# Patient Record
Sex: Female | Born: 1960 | Race: Black or African American | Hispanic: No | Marital: Single | Smoking: Former smoker
Health system: Southern US, Community
[De-identification: ages and names within clinical notes are randomized; demographics above are authoritative.]

## PROBLEM LIST (undated history)

## (undated) DIAGNOSIS — I10 Essential (primary) hypertension: Secondary | ICD-10-CM

---

## 2015-10-06 ENCOUNTER — Emergency Department (HOSPITAL_BASED_OUTPATIENT_CLINIC_OR_DEPARTMENT_OTHER)
Admission: EM | Admit: 2015-10-06 | Discharge: 2015-10-06 | Disposition: A | Discharge status: Other Acute Inpatient Hospital | Payer: BLUE CROSS/BLUE SHIELD | Attending: Emergency Medicine | Admitting: Emergency Medicine

## 2015-10-06 ENCOUNTER — Emergency Department (HOSPITAL_BASED_OUTPATIENT_CLINIC_OR_DEPARTMENT_OTHER): Payer: BLUE CROSS/BLUE SHIELD

## 2015-10-06 ENCOUNTER — Encounter (HOSPITAL_BASED_OUTPATIENT_CLINIC_OR_DEPARTMENT_OTHER): Payer: Self-pay | Admitting: *Deleted

## 2015-10-06 ENCOUNTER — Emergency Department (HOSPITAL_COMMUNITY): Payer: BLUE CROSS/BLUE SHIELD

## 2015-10-06 DIAGNOSIS — Z87891 Personal history of nicotine dependence: Secondary | ICD-10-CM | POA: Insufficient documentation

## 2015-10-06 DIAGNOSIS — I10 Essential (primary) hypertension: Secondary | ICD-10-CM | POA: Diagnosis not present

## 2015-10-06 DIAGNOSIS — R200 Anesthesia of skin: Secondary | ICD-10-CM | POA: Diagnosis not present

## 2015-10-06 HISTORY — DX: Essential (primary) hypertension: I10

## 2015-10-06 LAB — DIFFERENTIAL
BASOS ABS: 0 10*3/uL (ref 0.0–0.1)
Basophils Relative: 0 %
EOS PCT: 2 %
Eosinophils Absolute: 0.1 10*3/uL (ref 0.0–0.7)
LYMPHS ABS: 1.6 10*3/uL (ref 0.7–4.0)
LYMPHS PCT: 29 %
Monocytes Absolute: 0.4 10*3/uL (ref 0.1–1.0)
Monocytes Relative: 8 %
NEUTROS ABS: 3.5 10*3/uL (ref 1.7–7.7)
NEUTROS PCT: 61 %

## 2015-10-06 LAB — CBC
HCT: 37 % (ref 36.0–46.0)
HEMOGLOBIN: 11.8 g/dL — AB (ref 12.0–15.0)
MCH: 27.6 pg (ref 26.0–34.0)
MCHC: 31.9 g/dL (ref 30.0–36.0)
MCV: 86.4 fL (ref 78.0–100.0)
Platelets: 275 10*3/uL (ref 150–400)
RBC: 4.28 MIL/uL (ref 3.87–5.11)
RDW: 15.8 % — ABNORMAL HIGH (ref 11.5–15.5)
WBC: 5.7 10*3/uL (ref 4.0–10.5)

## 2015-10-06 LAB — URINALYSIS, ROUTINE W REFLEX MICROSCOPIC
Bilirubin Urine: NEGATIVE
Glucose, UA: NEGATIVE mg/dL
Hgb urine dipstick: NEGATIVE
Ketones, ur: NEGATIVE mg/dL
Leukocytes, UA: NEGATIVE
NITRITE: NEGATIVE
PROTEIN: NEGATIVE mg/dL
SPECIFIC GRAVITY, URINE: 1.015 (ref 1.005–1.030)
pH: 6 (ref 5.0–8.0)

## 2015-10-06 LAB — RAPID URINE DRUG SCREEN, HOSP PERFORMED
Amphetamines: NOT DETECTED
BARBITURATES: NOT DETECTED
Benzodiazepines: NOT DETECTED
Cocaine: NOT DETECTED
Opiates: NOT DETECTED
Tetrahydrocannabinol: POSITIVE — AB

## 2015-10-06 LAB — COMPREHENSIVE METABOLIC PANEL
ALBUMIN: 3.8 g/dL (ref 3.5–5.0)
ALK PHOS: 104 U/L (ref 38–126)
ALT: 10 U/L — AB (ref 14–54)
AST: 22 U/L (ref 15–41)
Anion gap: 11 (ref 5–15)
BILIRUBIN TOTAL: 0.4 mg/dL (ref 0.3–1.2)
BUN: 15 mg/dL (ref 6–20)
CALCIUM: 8.8 mg/dL — AB (ref 8.9–10.3)
CO2: 23 mmol/L (ref 22–32)
CREATININE: 0.87 mg/dL (ref 0.44–1.00)
Chloride: 109 mmol/L (ref 101–111)
GFR calc Af Amer: >60 mL/min (ref >60)
GFR calc non Af Amer: >60 mL/min (ref >60)
GLUCOSE: 90 mg/dL (ref 65–99)
Potassium: 3.4 mmol/L — ABNORMAL LOW (ref 3.5–5.1)
Sodium: 143 mmol/L (ref 135–145)
TOTAL PROTEIN: 7 g/dL (ref 6.5–8.1)

## 2015-10-06 LAB — APTT: aPTT: 28 seconds (ref 24–37)

## 2015-10-06 LAB — TROPONIN I

## 2015-10-06 LAB — ETHANOL: Alcohol, Ethyl (B): <5 mg/dL (ref <5)

## 2015-10-06 LAB — PROTIME-INR
INR: 0.93 (ref 0.00–1.49)
Prothrombin Time: 12.7 seconds (ref 11.6–15.2)

## 2015-10-06 MED ORDER — ASPIRIN 81 MG PO CHEW
81.0000 mg | CHEWABLE_TABLET | Freq: Once | ORAL | Status: AC
  Administered 2015-10-06: 81 mg via ORAL
  Filled 2015-10-06: qty 1

## 2015-10-06 NOTE — ED Notes (Signed)
Woke with numbness to the left side of her face and numbness to her finger tips of her left hand.

## 2015-10-06 NOTE — ED Provider Notes (Signed)
  Physical Exam  BP 147/92 mmHg  Pulse 78  Temp(Src) 98.4 F (36.9 C) (Oral)  Resp 14  Ht  (1.651 m)  Wt 201 lb (91.173 kg)  BMI 33.45 kg/m2  SpO2 100%  Physical Exam  ED Course  Procedures  MDM Patient transferred from [facility]. Sent for MRI after numbness of face and upper extremity. MRI reassuring. Reviewed with Dr. Lavon Paganini. Will discharge with outpatient follow-up. Patient states she has been having headaches does have possible, acute migraine.      Benjiman Core, MD 10/06/15 [phone]

## 2015-10-06 NOTE — ED Provider Notes (Signed)
CSN: [id number]     Arrival date & time 10/06/15  1248 History   First MD Initiated Contact with Patient 10/06/15 1328     Chief Complaint  Patient presents with  . Numbness     (Consider location/radiation/quality/duration/timing/severity/associated sxs/prior Treatment) The history is provided by the patient and medical records.    55 year old female with history of hypertension, diabetes, hyperlipidemia, presenting to the ED for numbness. Patient states she was watching television with her daughter last night and felt fine. She states she went to bed at approximately 0230 this morning. She states she woke up around 0900 and felt numbness to the entire left side of her face and fingertips of her left hand. She states she feels it on the left side of her lips as well.  No oral or lip swelling.  States she feels her face feels like she has been numbed up with Novocain at the dentist.  She denies any visual disturbance, slurred speech, facial droop, weakness, dizziness, or difficulty ambulating. Patient denies history of similar symptoms in the past. She denies any current headache or neck pain. No recent fever, chills, or other illness.  Patient states her grandmother did have a massive brainstem stroke, she was in her 80s when this occurred, but she did have many strokes prior to this. Patient is not currently a smoker. Denies any recent alcohol or drug use.  Not currently on anti-coagulation.  VSS.  Past Medical History  Diagnosis Date  . Hypertension    History reviewed. No pertinent past surgical history. No family history on file. Social History  Substance Use Topics  . Smoking status: Former Games developer  . Smokeless tobacco: None  . Alcohol Use: No   OB History    No data available     Review of Systems  Neurological: Positive for numbness.  All other systems reviewed and are negative.     Allergies  Review of patient's allergies indicates no known allergies.  Home Medications    Prior to Admission medications   Not on File   BP 149/95 mmHg  Pulse 74  Temp(Src) 98.4 F (36.9 C) (Oral)  Resp 21  Ht  (1.651 m)  Wt 91.173 kg  BMI 33.45 kg/m2  SpO2 100%   Physical Exam  Constitutional: She is oriented to person, place, and time. She appears well-developed and well-nourished. No distress.  HENT:  Head: Normocephalic and atraumatic.  Mouth/Throat: Oropharynx is clear and moist.  Eyes: Conjunctivae and EOM are normal. Pupils are equal, round, and reactive to light.  Neck: Normal range of motion. Neck supple.  Cardiovascular: Normal rate, regular rhythm and normal heart sounds.   Pulmonary/Chest: Effort normal and breath sounds normal. No respiratory distress. She has no wheezes.  Abdominal: Soft. Bowel sounds are normal. There is no tenderness. There is no guarding.  Musculoskeletal: Normal range of motion.  Neurological: She is alert and oriented to person, place, and time.  AAOx3, answering questions and following commands appropriately; equal strength UE and LE bilaterally; moves all extremities appropriately without ataxia; normal finger to nose bilaterally without dysmetria, no pronator drift; endorses decreased sensation to left forehead, cheek, chin, and lips; symmetric forehead wrinkle; no facial droop noted  Skin: Skin is warm and dry. She is not diaphoretic.  Psychiatric: She has a normal mood and affect.  Nursing note and vitals reviewed.   ED Course  Procedures (including critical care time) Labs Review Labs Reviewed  CBC - Abnormal; Notable for the following:  Hemoglobin 11.8 (*)    RDW 15.8 (*)    All other components within normal limits  COMPREHENSIVE METABOLIC PANEL - Abnormal; Notable for the following:    Potassium 3.4 (*)    Calcium 8.8 (*)    ALT 10 (*)    All other components within normal limits  URINE RAPID DRUG SCREEN, HOSP PERFORMED - Abnormal; Notable for the following:    Tetrahydrocannabinol POSITIVE (*)    All  other components within normal limits  ETHANOL  PROTIME-INR  APTT  DIFFERENTIAL  URINALYSIS, ROUTINE W REFLEX MICROSCOPIC (NOT AT [facility])  TROPONIN I    Imaging Review Ct Head Wo Contrast  10/06/2015  CLINICAL DATA:  Woke up this morning with left facial numbness and tingling in finger tips EXAM: CT HEAD WITHOUT CONTRAST TECHNIQUE: Contiguous axial images were obtained from the base of the skull through the vertex without intravenous contrast. COMPARISON:  None. FINDINGS: Mild age-related cortical atrophy. No abnormal attenuation to suggest vascular territory infarct or mass. No hydrocephalus. No parenchymal hemorrhage or extra-axial fluid. Calvarium intact. IMPRESSION: Head CT is normal for age. Electronically Signed   By: Esperanza Heir M.D.   On: 10/06/2015 14:00   I have personally reviewed and evaluated these images and lab results as part of my medical decision-making.   EKG Interpretation None      MDM   Final diagnoses:  Left facial numbness   55 year old female here for left facial numbness and paresthesias of left fingertips upon waking this morning at 0900.  Last known well was 0230 this morning.  On exam patient is in no acute distress. Status subjective numbness to the left side of her face including the lips. She has no apparent facial droop and is otherwise neurologically intact. Initial labs reassuring, UDS is positive for THC.  CT head negative for acute abnormalities. Patient was given dose of aspirin. Will speak with neurology as patient likely will need transfer for MRI to definitively rule out stroke.  2:35 PM  Case discussed with neurology, Dr. Hosie Poisson-- recommends transfer to [facility] for [facility].  Will do ED-ED transfer.  If negative, may be discharge with outpatient follow-up.  Will need admission for any acute findings.  ER physician at [facility], Dr. Anitra Lauth, notified of transfer.  Patient and family updated with plan of care, vague knowledge understanding and all questions  were answered. Patient remains stable at this time.  4:07 PM Patient still waiting for [facility] transfer at this time.  Remains stable without complaints.  Garlon Hatchet, PA-C 10/06/15 1608  Courteney Randall An, MD 10/10/15 2238

## 2015-10-06 NOTE — ED Notes (Signed)
The admission vs were done at 1250

## 2015-10-06 NOTE — ED Notes (Signed)
Report from [facility]: 153/100, pt coming from [facility] with c/o left arm and jaw numbness. Pt has passed swallow screen, alert and oriented. CT scan negative.

## 2015-10-06 NOTE — Discharge Instructions (Signed)
Paresthesia Paresthesia is an abnormal burning or prickling sensation. This sensation is generally felt in the hands, arms, legs, or feet. However, it may occur in any part of the body. Usually, it is not painful. The feeling may be described as:  Tingling or numbness.  Pins and needles.  Skin crawling.  Buzzing.  Limbs falling asleep.  Itching. Most people experience temporary (transient) paresthesia at some time in their lives. Paresthesia may occur when you breathe too quickly (hyperventilation). It can also occur without any apparent cause. Commonly, paresthesia occurs when pressure is placed on a nerve. The sensation quickly goes away after the pressure is removed. For some people, however, paresthesia is a long-lasting (chronic) condition that is caused by an underlying disorder. If you continue to have paresthesia, you may need further medical evaluation. HOME CARE INSTRUCTIONS Watch your condition for any changes. Taking the following actions may help to lessen any discomfort that you are feeling:  Avoid drinking alcohol.  Try acupuncture or massage to help relieve your symptoms.  Keep all follow-up visits as directed by your health care provider. This is important. SEEK MEDICAL CARE IF:  You continue to have episodes of paresthesia.  Your burning or prickling feeling gets worse when you walk.  You have pain, cramps, or dizziness.  You develop a rash. SEEK IMMEDIATE MEDICAL CARE IF:  You feel weak.  You have trouble walking or moving.  You have problems with speech, understanding, or vision.  You feel confused.  You cannot control your bladder or bowel movements.  You have numbness after an injury.  You faint.   This information is not intended to replace advice given to you by your health care provider. Make sure you discuss any questions you have with your health care provider.   Document Released: 08/02/2002 Document Revised: 12/27/2014 Document Reviewed:  08/08/2014 Elsevier Interactive Patient Education 2016 [employer]  

## 2017-05-10 IMAGING — CT CT HEAD W/O CM
1 series · 16 of 30 positions shown, 20 images · non-contrast
Comparison: None.

CLINICAL DATA: Woke up this morning with left facial numbness and
tingling in finger tips

EXAM:
CT HEAD WITHOUT CONTRAST
TECHNIQUE: Contiguous axial images were obtained from the base of the skull
through the vertex without intravenous contrast.

[Series 2: head wo · axial · 0.45mm/px · z∈[-207,-72]mm · 16 of 33 slices shown, 20 images]
[im 2/33  brain]
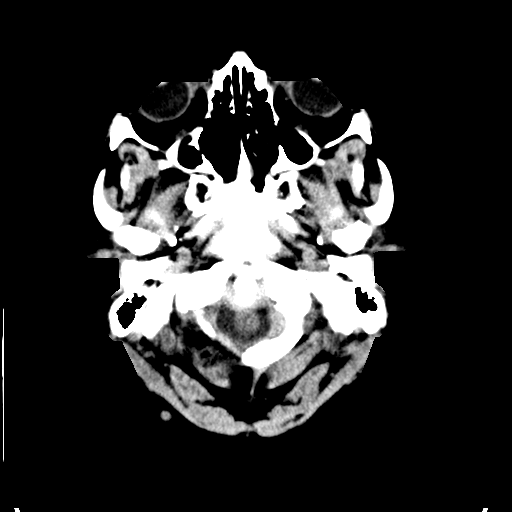
[im 2/33  bone]
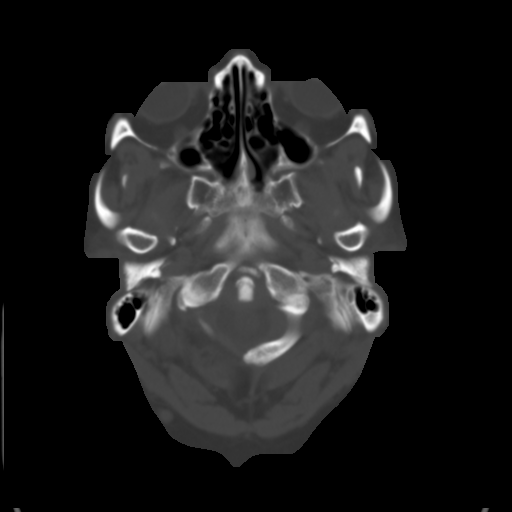
[im 4/33  brain]
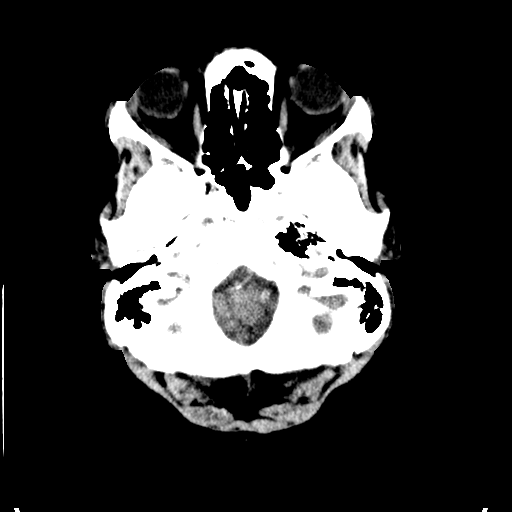
[im 6/33  brain]
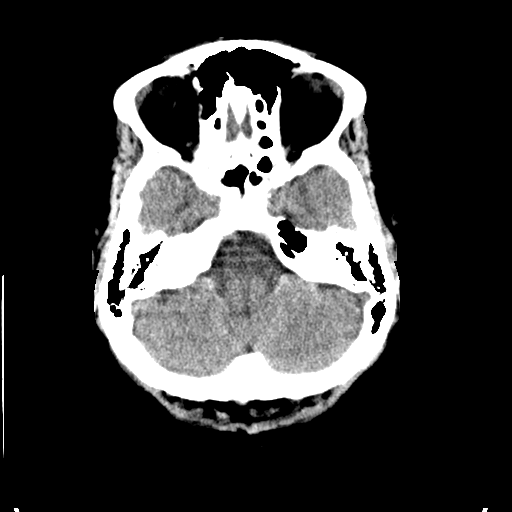
[im 8/33  brain]
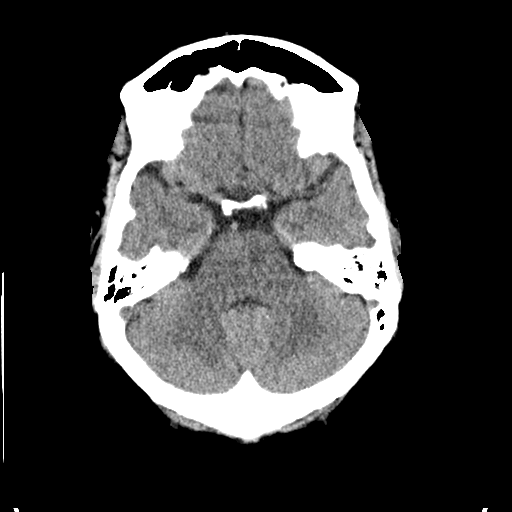
[im 9/33  brain]
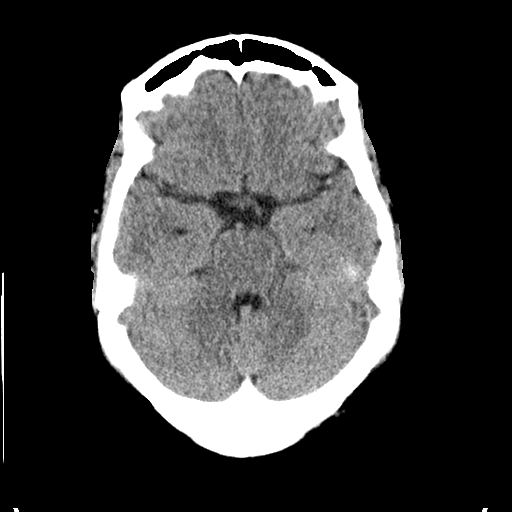
[im 9/33  bone]
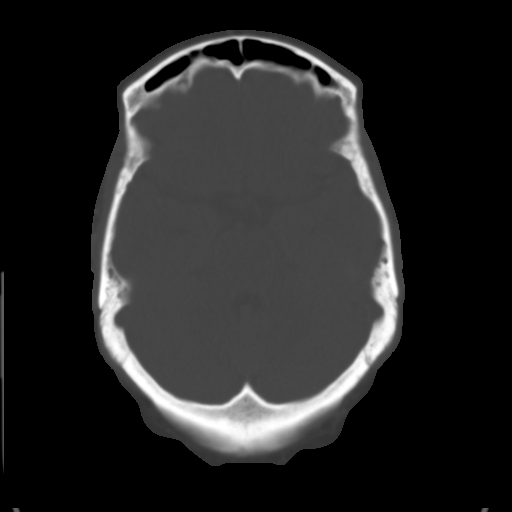
[im 12/33  brain]
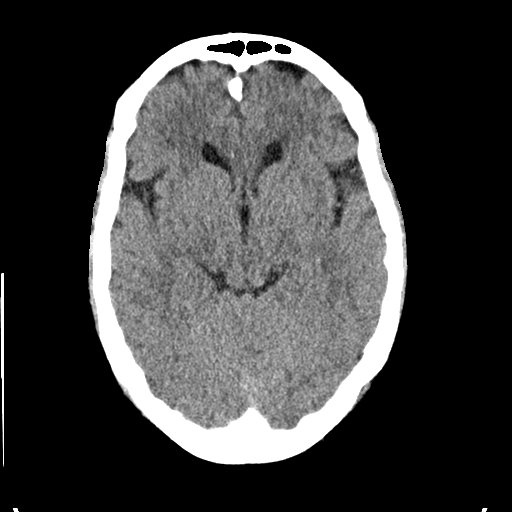
[im 14/33  brain]
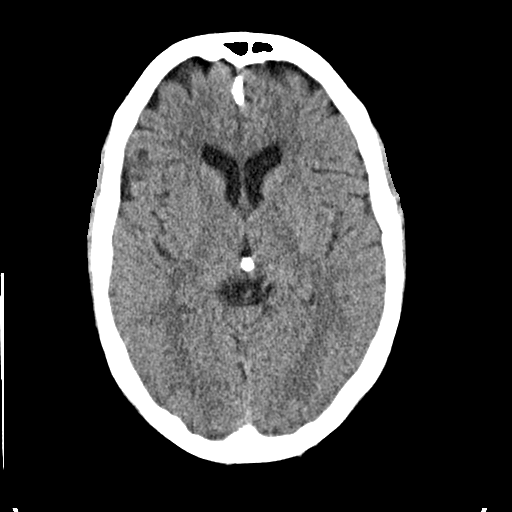
[im 16/33  brain]
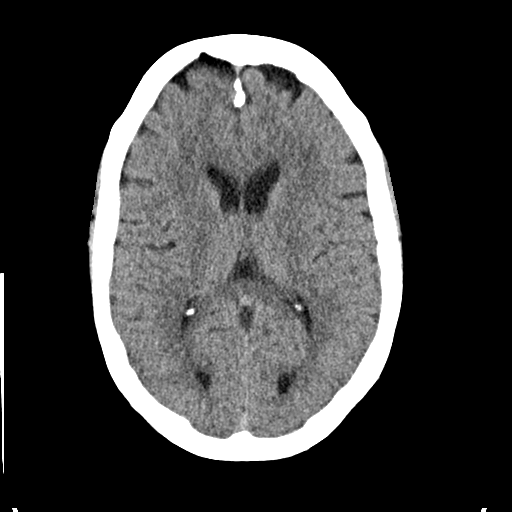
[im 17/33  brain]
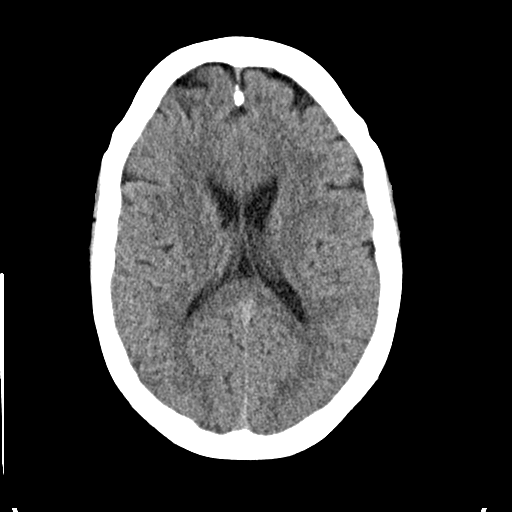
[im 17/33  bone]
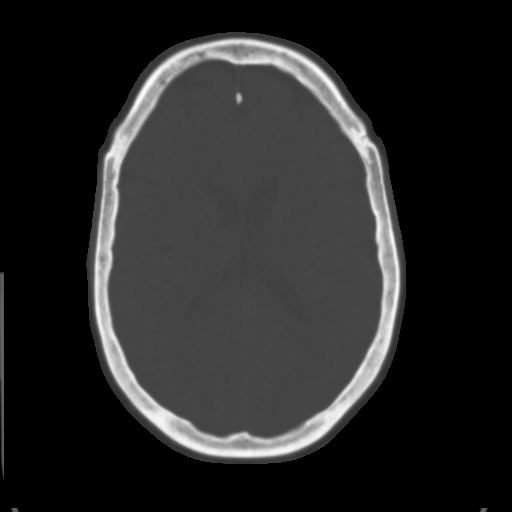
[im 19/33  brain]
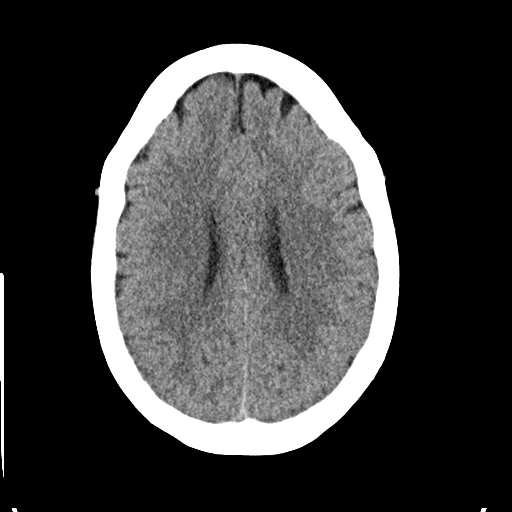
[im 21/33  brain]
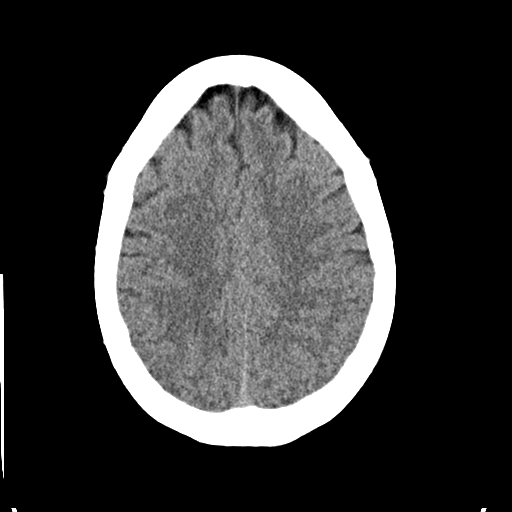
[im 24/33  brain]
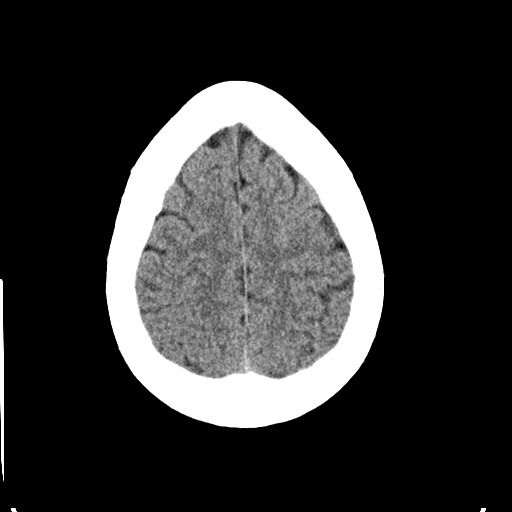
[im 25/33  brain]
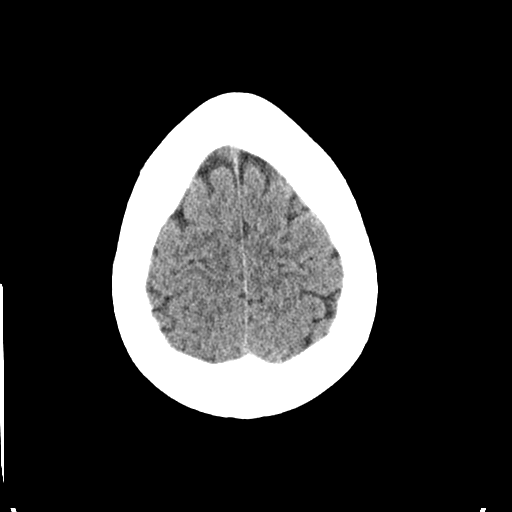
[im 25/33  bone]
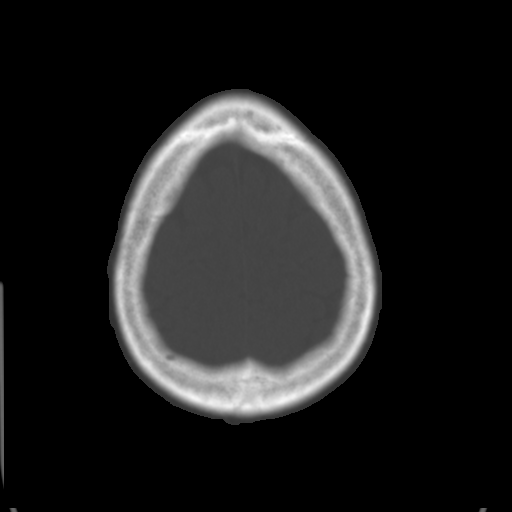
[im 27/33  brain]
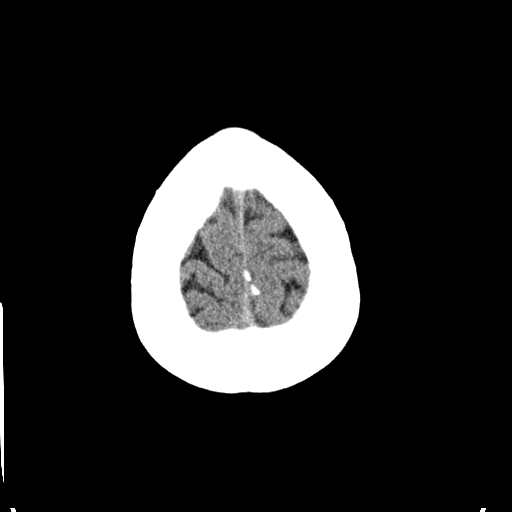
[im 29/33  brain]
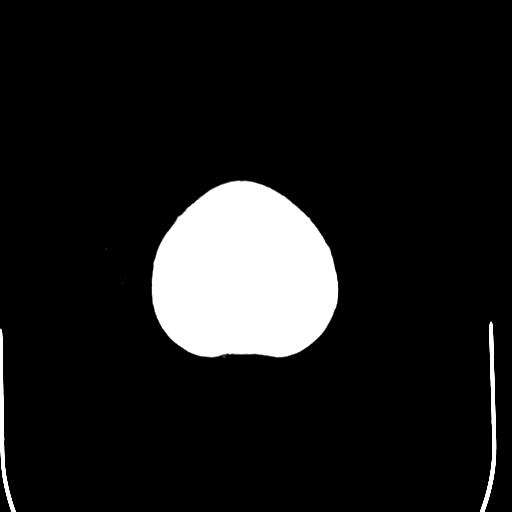
[im 31/33  brain]
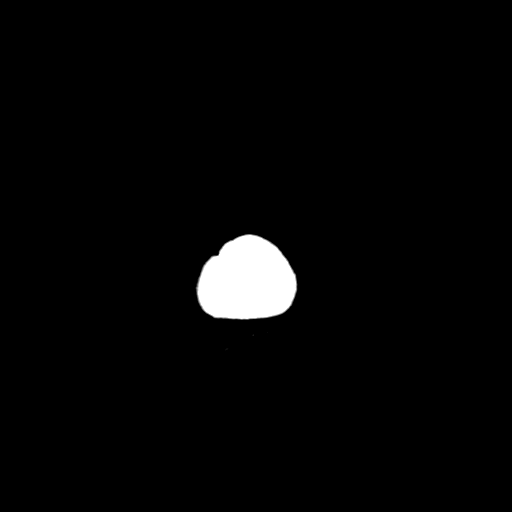

[16 of 30 positions shown; findings below may reference images not displayed]

FINDINGS: Mild age-related cortical atrophy. No abnormal attenuation to
suggest vascular territory infarct or mass. No hydrocephalus. No
parenchymal hemorrhage or extra-axial fluid. Calvarium intact.
IMPRESSION: Head CT is normal for age.

## 2023-08-10 ENCOUNTER — Other Ambulatory Visit: Payer: Self-pay

## 2023-08-10 ENCOUNTER — Encounter (HOSPITAL_BASED_OUTPATIENT_CLINIC_OR_DEPARTMENT_OTHER): Payer: Self-pay | Admitting: Emergency Medicine

## 2023-08-10 ENCOUNTER — Emergency Department (HOSPITAL_BASED_OUTPATIENT_CLINIC_OR_DEPARTMENT_OTHER)
Admission: EM | Admit: 2023-08-10 | Discharge: 2023-08-10 | Disposition: A | Discharge status: Home / Self Care | Payer: BC Managed Care – PPO | Attending: Emergency Medicine | Admitting: Emergency Medicine

## 2023-08-10 DIAGNOSIS — Z7982 Long term (current) use of aspirin: Secondary | ICD-10-CM | POA: Insufficient documentation

## 2023-08-10 DIAGNOSIS — M5416 Radiculopathy, lumbar region: Secondary | ICD-10-CM | POA: Diagnosis not present

## 2023-08-10 DIAGNOSIS — M545 Low back pain, unspecified: Secondary | ICD-10-CM | POA: Diagnosis present

## 2023-08-10 MED ORDER — METHOCARBAMOL 500 MG PO TABS: 1000.0000 mg | ORAL_TABLET | Freq: Four times a day (QID) | ORAL | 0 refills | Status: AC

## 2023-08-10 MED ORDER — OXYCODONE HCL 5 MG PO TABS
5.0000 mg | ORAL_TABLET | Freq: Once | ORAL | Status: AC
  Administered 2023-08-10: 5 mg via ORAL
  Filled 2023-08-10: qty 1

## 2023-08-10 MED ORDER — MELOXICAM 7.5 MG PO TABS: 7.5000 mg | ORAL_TABLET | Freq: Every day | ORAL | 0 refills | Status: AC

## 2023-08-10 MED ORDER — KETOROLAC TROMETHAMINE 15 MG/ML IJ SOLN
30.0000 mg | Freq: Once | INTRAMUSCULAR | Status: AC
  Administered 2023-08-10: 30 mg via INTRAMUSCULAR
  Filled 2023-08-10: qty 2

## 2023-08-10 NOTE — ED Provider Notes (Signed)
[place] [facility] AT [facility] Provider Note   CSN: [id number] Arrival date & time: 08/10/23  1500     History  Chief Complaint  Patient presents with   Back Pain    Nancy Haas is a 62 y.o. female.  Patient presents to the emergency department today for evaluation of pain in her left buttock area that radiates down her left leg to her ankle for approximately 1 week.  She denies any initial falls or injuries.  She has not had this problem in the past.  She was seen at urgent care 4 days ago and was given a shot of Toradol and a Medrol Dosepak which has not been helping.  She denies any weakness or falls.  No foot drop reported. Patient denies warning symptoms of back pain including: fecal incontinence, urinary retention or overflow incontinence, night sweats, waking from sleep with back pain, unexplained fevers or weight loss, h/o cancer, IVDU, recent trauma.  She has not had any imaging of her back.      Home Medications Prior to Admission medications   Medication Sig Start Date End Date Taking? Authorizing Provider  acetaminophen (TYLENOL) 500 MG tablet Take 500 mg by mouth every 6 (six) hours as needed for moderate pain.    [provider]  Aspirin-Acetaminophen-Caffeine (GOODY HEADACHE PO) Take 1 Package by mouth daily as needed (for pain).    [provider]      Allergies    Patient has no known allergies.    Review of Systems   Review of Systems  Physical Exam Updated Vital Signs BP (!) 157/86 (BP Location: Right Arm)   Pulse 83   Temp 98.1 F (36.7 C) (Oral)   Resp 18   Ht 5\' 5"  (1.651 m)   Wt 83.9 kg   SpO2 100%   BMI 30.79 kg/m   Physical Exam Vitals and nursing note reviewed.  Constitutional:      Appearance: She is well-developed.  HENT:     Head: Normocephalic and atraumatic.  Eyes:     Conjunctiva/sclera: Conjunctivae normal.  Pulmonary:     Effort: Pulmonary effort is normal.  Abdominal:      Palpations: Abdomen is soft.     Tenderness: There is no abdominal tenderness.  Musculoskeletal:     Cervical back: Normal range of motion and neck supple. No tenderness.     Thoracic back: No tenderness. Normal range of motion.     Lumbar back: Tenderness present. No bony tenderness. Positive left straight leg raise test.       Back:     Comments: No step-off noted with palpation of spine.   Patient winces with and reports tenderness to palpation in the left lower lumbar and SI area.  Skin:    General: Skin is warm and dry.     Findings: No rash.  Neurological:     Mental Status: She is alert.     Sensory: No sensory deficit.     Comments: 5/5 strength in entire lower extremities bilaterally. No sensation deficit.      ED Results / Procedures / Treatments   Labs (all labs ordered are listed, but only abnormal results are displayed) Labs Reviewed - No data to display  EKG None  Radiology No results found.  Procedures Procedures    Medications Ordered in ED Medications  ketorolac (TORADOL) 15 MG/ML injection 30 mg (has no administration in time range)  oxyCODONE (Oxy IR/ROXICODONE) immediate release tablet 5 mg (  has no administration in time range)    ED Course/ Medical Decision Making/ A&P    Patient seen and examined. History obtained directly from patient.    Labs/EKG: None ordered.  Imaging: None ordered.  Discussed obtaining imaging today with CT lumbar with the patient given that she has not had this problem in the past and has not had improvement with standard treatment.  We discussed limitations of CT.  Discussed that she could also continue taking medications for symptom control and give more time to see if improvement occurs and follow up with PCP or neurosurgery as necessary.  After discussion, patient would like to defer any imaging today.  Medications/Fluids: Ordered: IM Toradol, p.o. oxycodone  Most recent vital signs reviewed and are as follows: BP  (!) 157/86 (BP Location: Right Arm)   Pulse 83   Temp 98.1 F (36.7 C) (Oral)   Resp 18   Ht 5\' 5"  (1.651 m)   Wt 83.9 kg   SpO2 100%   BMI 30.79 kg/m   Initial impression: Low back pain/buttock pain with radicular features  Home treatment plan: Patient was counseled on back pain precautions and advised to do activity as tolerated but avoid strenuous activity and do not lift, push, or pull heavy objects more than 10 pounds for the next week.  Patient counseled to use ice or heat on back as needed for pain and spasm.    Medications prescribed: Robaxin for muscle pain and spasm. Patient counseled on proper use of muscle relaxant medication.  They were requested not to drink alcohol, drive any vehicle, or do any dangerous activities while taking this medication due to potential drowsiness and unintended.  Patient verbalized understanding.  Will also give prescription for meloxicam.  Return instructions discussed with patient: Urged to return with worsening severe pain, loss of bowel or bladder control, trouble walking, development of weakness in the legs, or with any other concerns.  Follow-up instructions discussed with patient: Patient urged to follow-up with PCP if pain does not improve with treatment and rest or if pain becomes recurrent.                                 Medical Decision Making Risk Prescription drug management.   Patient with back pain with radicular features. No neurological deficits. Patient is ambulatory. No warning symptoms of back pain including: fecal incontinence, urinary retention or overflow incontinence, night sweats, waking from sleep with back pain, unexplained fevers or weight loss, h/o cancer, IVDU, recent trauma. Defers imaging at this time. Low concern for cauda equina, epidural abscess, or other serious cause of back pain. Conservative measures such as rest, ice/heat and pain medicine indicated with PCP follow-up if no improvement with conservative  management.          Final Clinical Impression(s) / ED Diagnoses Final diagnoses:  Lumbar radiculopathy    Rx / DC Orders ED Discharge Orders          Ordered    meloxicam (MOBIC) 7.5 MG tablet  Daily        08/10/23 1618    methocarbamol (ROBAXIN) 500 MG tablet  4 times daily        08/10/23 1618              Renne Crigler, PA-C 08/10/23 1621    Glyn Ade, MD 08/10/23 1754

## 2023-08-10 NOTE — ED Triage Notes (Addendum)
Pt c/o LT lower back pain w/ radiation down LLE since Wed; no injury

## 2023-08-10 NOTE — Discharge Instructions (Signed)
Please read and follow all provided instructions.  Your diagnoses today include:  1. Lumbar radiculopathy    Tests performed today include: Vital signs - see below for your results today  Medications prescribed:  Robaxin (methocarbamol) - muscle relaxer medication  DO NOT drive or perform any activities that require you to be awake and alert because this medicine can make you drowsy.   Meloxicam - anti-inflammatory pain medication  You have been prescribed an anti-inflammatory medication or NSAID. Take with food. Do not take aspirin, ibuprofen, or naproxen if taking this medication. Take smallest effective dose for the shortest duration needed for your pain. Stop taking if you experience stomach pain or vomiting.   Take any prescribed medications only as directed.  Home care instructions:  Follow any educational materials contained in this packet Please rest, use ice or heat on your back for the next several days Do not lift, push, pull anything more than 10 pounds for the next week  Follow-up instructions: Please follow-up with your primary care provider or the neurosurgery referral in the next 1 week for further evaluation of your symptoms.   Return instructions:  SEEK IMMEDIATE MEDICAL ATTENTION IF YOU HAVE: New numbness, tingling, weakness, or problem with the use of your arms or legs Severe back pain not relieved with medications Loss control of your bowels or bladder Increasing pain in any areas of the body (such as chest or abdominal pain) Shortness of breath, dizziness, or fainting.  Worsening nausea (feeling sick to your stomach), vomiting, fever, or sweats Any other emergent concerns regarding your health   Additional Information:  Your vital signs today were: BP (!) 157/86 (BP Location: Right Arm)   Pulse 83   Temp 98.1 F (36.7 C) (Oral)   Resp 18   Ht 5\' 5"  (1.651 m)   Wt 83.9 kg   SpO2 100%   BMI 30.79 kg/m  If your blood pressure (BP) was elevated  above 135/85 this visit, please have this repeated by your doctor within one month. --------------

## 2023-09-02 ENCOUNTER — Emergency Department (HOSPITAL_COMMUNITY)
Admission: EM | Admit: 2023-09-02 | Discharge: 2023-09-02 | Disposition: A | Discharge status: Home / Self Care | Payer: BC Managed Care – PPO | Attending: Emergency Medicine | Admitting: Emergency Medicine

## 2023-09-02 ENCOUNTER — Emergency Department (HOSPITAL_COMMUNITY): Payer: BC Managed Care – PPO

## 2023-09-02 ENCOUNTER — Other Ambulatory Visit: Payer: Self-pay

## 2023-09-02 DIAGNOSIS — I10 Essential (primary) hypertension: Secondary | ICD-10-CM | POA: Diagnosis not present

## 2023-09-02 DIAGNOSIS — F410 Panic disorder [episodic paroxysmal anxiety] without agoraphobia: Secondary | ICD-10-CM | POA: Diagnosis not present

## 2023-09-02 DIAGNOSIS — Z7982 Long term (current) use of aspirin: Secondary | ICD-10-CM | POA: Insufficient documentation

## 2023-09-02 DIAGNOSIS — R0789 Other chest pain: Secondary | ICD-10-CM | POA: Diagnosis present

## 2023-09-02 DIAGNOSIS — F17200 Nicotine dependence, unspecified, uncomplicated: Secondary | ICD-10-CM | POA: Diagnosis not present

## 2023-09-02 DIAGNOSIS — R079 Chest pain, unspecified: Secondary | ICD-10-CM

## 2023-09-02 LAB — CBC
HCT: 36 % (ref 36.0–46.0)
Hemoglobin: 11.5 g/dL — ABNORMAL LOW (ref 12.0–15.0)
MCH: 27 pg (ref 26.0–34.0)
MCHC: 31.9 g/dL (ref 30.0–36.0)
MCV: 84.5 fL (ref 80.0–100.0)
Platelets: 235 10*3/uL (ref 150–400)
RBC: 4.26 MIL/uL (ref 3.87–5.11)
RDW: 21.3 % — ABNORMAL HIGH (ref 11.5–15.5)
WBC: 6.4 10*3/uL (ref 4.0–10.5)
nRBC: 0 % (ref 0.0–0.2)

## 2023-09-02 LAB — BASIC METABOLIC PANEL
Anion gap: 10 (ref 5–15)
BUN: 6 mg/dL — ABNORMAL LOW (ref 8–23)
CO2: 23 mmol/L (ref 22–32)
Calcium: 8.5 mg/dL — ABNORMAL LOW (ref 8.9–10.3)
Chloride: 105 mmol/L (ref 98–111)
Creatinine, Ser: 0.64 mg/dL (ref 0.44–1.00)
GFR, Estimated: >60 mL/min (ref >60)
Glucose, Bld: 101 mg/dL — ABNORMAL HIGH (ref 70–99)
Potassium: 3 mmol/L — ABNORMAL LOW (ref 3.5–5.1)
Sodium: 138 mmol/L (ref 135–145)

## 2023-09-02 LAB — TROPONIN I (HIGH SENSITIVITY)
Troponin I (High Sensitivity): 7 ng/L (ref <18)
Troponin I (High Sensitivity): 7 ng/L (ref <18)

## 2023-09-02 MED ORDER — POTASSIUM CHLORIDE CRYS ER 20 MEQ PO TBCR
40.0000 meq | EXTENDED_RELEASE_TABLET | Freq: Once | ORAL | Status: AC
  Administered 2023-09-02: 40 meq via ORAL
  Filled 2023-09-02: qty 2

## 2023-09-02 NOTE — ED Triage Notes (Signed)
 Pt. Was at the [facility] dr to be seen for severe pain  to her lt. Buttocks pain radiates into her lt leg.  While she was there she developed chest pain.   They would not see her and sent her to us .  She is pain free at this time.  She denies any cardiac history.

## 2023-09-02 NOTE — ED Provider Notes (Signed)
 [facility] Provider Note   CSN: [id number] Arrival date & time: 09/02/23  1518     History  Chief Complaint  Patient presents with   Chest Pain    Nancy Haas is a 63 y.o. female.  Patient is a 63 year old female with a history of hypertension and tobacco abuse as well as asthma who presents today with complaint of chest pain that started after she got to the neurosurgeons office.  She reports that for the last month she has had terrible pain in the left side of her back that radiates down her leg causing sharp pain and numbness at times.  She had waited for a month to get into see the neurosurgeons and when she got there today she reports feeling very anxious and then started having tightness in her chest that scared her.  It lasted for about 5 or 10 minutes about 240 this afternoon.  She was able to eventually calm herself down but they were concerned at the office and sent her here for evaluation.  She denies feeling short of breath or having recent cough or asthma symptoms.  She has no unilateral leg pain or swelling and no prior history of PEs.  She denies any prior cardiac history and it does not run in her family.  The history is provided by the patient.  Chest Pain      Home Medications Prior to Admission medications   Medication Sig Start Date End Date Taking? Authorizing Provider  acetaminophen (TYLENOL) 500 MG tablet Take 500 mg by mouth every 6 (six) hours as needed for moderate pain.    [provider]  Aspirin -Acetaminophen-Caffeine (GOODY HEADACHE PO) Take 1 Package by mouth daily as needed (for pain).    [provider]  meloxicam  (MOBIC ) 7.5 MG tablet Take 1 tablet (7.5 mg total) by mouth daily. 08/10/23   Geiple, Joshua, PA-C  methocarbamol  (ROBAXIN ) 500 MG tablet Take 2 tablets (1,000 mg total) by mouth 4 (four) times daily. 08/10/23   Geiple, Joshua, PA-C      Allergies    Patient has no known  allergies.    Review of Systems   Review of Systems  Cardiovascular:  Positive for chest pain.    Physical Exam Updated Vital Signs BP (!) 150/92 (BP Location: Right Arm)   Pulse 92   Temp 98.3 F (36.8 C)   Resp (!) 22   Ht 5' 5 (1.651 m)   Wt 81.2 kg   SpO2 100%   BMI 29.79 kg/m  Physical Exam Vitals and nursing note reviewed.  Constitutional:      General: She is not in acute distress.    Appearance: She is well-developed.  HENT:     Head: Normocephalic and atraumatic.  Eyes:     Pupils: Pupils are equal, round, and reactive to light.  Cardiovascular:     Rate and Rhythm: Normal rate and regular rhythm.     Heart sounds: Normal heart sounds. No murmur heard.    No friction rub.  Pulmonary:     Effort: Pulmonary effort is normal.     Breath sounds: Normal breath sounds. No wheezing or rales.  Abdominal:     General: Bowel sounds are normal. There is no distension.     Palpations: Abdomen is soft.     Tenderness: There is no abdominal tenderness. There is no guarding or rebound.  Musculoskeletal:        General: No tenderness. Normal  range of motion.     Right lower leg: No edema.     Left lower leg: No edema.     Comments: No edema  Skin:    General: Skin is warm and dry.     Findings: No rash.  Neurological:     Mental Status: She is alert and oriented to person, place, and time. Mental status is at baseline.     Cranial Nerves: No cranial nerve deficit.  Psychiatric:        Behavior: Behavior normal.     Comments: Intermittently tearful on exam     ED Results / Procedures / Treatments   Labs (all labs ordered are listed, but only abnormal results are displayed) Labs Reviewed  BASIC METABOLIC PANEL - Abnormal; Notable for the following components:      Result Value   Potassium 3.0 (*)    Glucose, Bld 101 (*)    BUN 6 (*)    Calcium 8.5 (*)    All other components within normal limits  CBC - Abnormal; Notable for the following components:    Hemoglobin 11.5 (*)    RDW 21.3 (*)    All other components within normal limits  TROPONIN I (HIGH SENSITIVITY)  TROPONIN I (HIGH SENSITIVITY)    EKG EKG Interpretation Date/Time:  Tuesday September 02 2023 15:36:10 EST Ventricular Rate:  71 PR Interval:  138 QRS Duration:  74 QT Interval:  408 QTC Calculation: 443 R Axis:   1  Text Interpretation: Normal sinus rhythm Possible Anterior infarct , age undetermined No significant change since last tracing When compared with ECG of 06-Oct-2015 13:03, PREVIOUS ECG IS PRESENT Confirmed by Doretha Folks ([id number]) on 09/02/2023 5:11:24 PM  Radiology DG Chest 2 View Result Date: 09/02/2023 CLINICAL DATA:  Chest tightness EXAM: CHEST - 2 VIEW COMPARISON:  None Available. FINDINGS: The heart size and mediastinal contours are within normal limits. No consolidation, pneumothorax or effusion. No edema. The visualized skeletal structures are unremarkable. Degenerative changes are seen along the spine. IMPRESSION: No acute cardiopulmonary disease. Electronically Signed   By: Ranell Bring M.D.   On: 09/02/2023 17:45    Procedures Procedures    Medications Ordered in ED Medications  potassium chloride  SA (KLOR-CON  M) CR tablet 40 mEq (40 mEq Oral Given 09/02/23 1726)    ED Course/ Medical Decision Making/ A&P                                 Medical Decision Making Amount and/or Complexity of Data Reviewed External Data Reviewed: notes. Labs: ordered. Decision-making details documented in ED Course. Radiology: ordered and independent interpretation performed. Decision-making details documented in ED Course. ECG/medicine tests: ordered and independent interpretation performed. Decision-making details documented in ED Course.  Risk Prescription drug management.   Pt with multiple medical problems and comorbidities and presenting today with a complaint that caries a high risk for morbidity and mortality.  Here today with complaint of chest pain  that occurred when she was at the neurosurgeons office.  Low suspicion for ACS today, low risk Wells criteria and low suspicion for PE.  Patient does not display any symptoms suggestive of pneumonia or infectious etiology.  No wheezing on exam to suggest asthma.  I independently interpreted patient's EKG and labs.  EKG without acute findings today.  No evidence of dysrhythmia or other abnormalities.  Initial troponin is normal but that was drawn within an hour of  her having symptoms.,  CBC within normal limits, BMP with hypokalemia with a potassium of 3.0 but otherwise normal.  Will get a chest x-ray and recheck patient's delta troponin to ensure no evidence of abnormality.  Otherwise patient appears clear for discharge.  7:35 PM Patient's delta troponin is negative.  I have independently visualized and interpreted pt's images today.  Rest x-ray within normal limits.  Patient's hypokalemia is most likely result of her blood pressure medication.  She reports she does have some potassium pills at home but also gave her food options to increase her potassium and follow-up with her doctor to make sure that it improves.  At this time feel that she is stable for discharge.  She is comfortable with this plan.          Final Clinical Impression(s) / ED Diagnoses Final diagnoses:  Nonspecific chest pain  Panic attack    Rx / DC Orders ED Discharge Orders     None         Doretha Folks, MD 09/02/23 1935

## 2023-09-02 NOTE — Discharge Instructions (Addendum)
 All the blood work today was normal.  Everything with your heart looks good and x-ray was normal.  You are clear to follow up with the neurosurgeon.  Seems like the pain today was most likely related to a panic attack.  However if you start having this regularly follow up with your regular doctor.

## 2023-09-02 NOTE — ED Notes (Signed)
 Pt d/c home per EDP order. Discharge summary revived, pt verbalizes understanding. NAD. Visitor is discharge ride home.

## 2023-09-16 ENCOUNTER — Other Ambulatory Visit (HOSPITAL_BASED_OUTPATIENT_CLINIC_OR_DEPARTMENT_OTHER): Payer: Self-pay | Admitting: Neurosurgery

## 2023-09-16 ENCOUNTER — Telehealth (HOSPITAL_BASED_OUTPATIENT_CLINIC_OR_DEPARTMENT_OTHER): Payer: Self-pay

## 2023-09-16 DIAGNOSIS — M5440 Lumbago with sciatica, unspecified side: Secondary | ICD-10-CM

## 2023-09-28 ENCOUNTER — Ambulatory Visit (HOSPITAL_BASED_OUTPATIENT_CLINIC_OR_DEPARTMENT_OTHER): Payer: BC Managed Care – PPO

## 2023-10-05 ENCOUNTER — Ambulatory Visit (HOSPITAL_BASED_OUTPATIENT_CLINIC_OR_DEPARTMENT_OTHER)
Admission: RE | Admit: 2023-10-05 | Discharge: 2023-10-05 | Disposition: A | Discharge status: Home / Self Care | Payer: BC Managed Care – PPO | Source: Ambulatory Visit | Attending: Neurosurgery | Admitting: Neurosurgery

## 2023-10-05 DIAGNOSIS — M5440 Lumbago with sciatica, unspecified side: Secondary | ICD-10-CM | POA: Diagnosis present
